# Patient Record
Sex: Male | Born: 1980
Health system: Southern US, Community
[De-identification: ages and names within clinical notes are randomized; demographics above are authoritative.]

## PROBLEM LIST (undated history)

## (undated) HISTORY — PX: TONSILLECTOMY: SUR1361

---

## 2004-04-21 ENCOUNTER — Ambulatory Visit: Payer: Self-pay | Admitting: Internal Medicine

## 2004-04-28 ENCOUNTER — Ambulatory Visit (HOSPITAL_COMMUNITY): Admission: RE | Admit: 2004-04-28 | Discharge: 2004-04-28 | Payer: Self-pay | Admitting: Internal Medicine

## 2004-05-12 ENCOUNTER — Ambulatory Visit (HOSPITAL_COMMUNITY): Admission: RE | Admit: 2004-05-12 | Discharge: 2004-05-12 | Payer: Self-pay | Admitting: Internal Medicine

## 2004-05-12 ENCOUNTER — Ambulatory Visit (HOSPITAL_COMMUNITY): Admission: RE | Admit: 2004-05-12 | Discharge: 2004-05-12 | Payer: Self-pay | Admitting: Family Medicine

## 2004-06-16 ENCOUNTER — Ambulatory Visit (HOSPITAL_COMMUNITY): Admission: RE | Admit: 2004-06-16 | Discharge: 2004-06-16 | Payer: Self-pay | Admitting: Family Medicine

## 2004-07-21 ENCOUNTER — Ambulatory Visit (HOSPITAL_COMMUNITY): Admission: RE | Admit: 2004-07-21 | Discharge: 2004-07-21 | Payer: Self-pay | Admitting: Family Medicine

## 2004-10-25 ENCOUNTER — Ambulatory Visit: Payer: Self-pay | Admitting: Orthopedic Surgery

## 2008-11-17 ENCOUNTER — Ambulatory Visit (HOSPITAL_COMMUNITY): Admission: RE | Admit: 2008-11-17 | Discharge: 2008-11-17 | Payer: Self-pay | Admitting: Family Medicine

## 2013-05-07 ENCOUNTER — Emergency Department (HOSPITAL_COMMUNITY): Payer: BC Managed Care – PPO

## 2013-05-07 ENCOUNTER — Encounter (HOSPITAL_COMMUNITY): Payer: Self-pay | Admitting: Emergency Medicine

## 2013-05-07 DIAGNOSIS — F17200 Nicotine dependence, unspecified, uncomplicated: Secondary | ICD-10-CM | POA: Insufficient documentation

## 2013-05-07 DIAGNOSIS — R071 Chest pain on breathing: Secondary | ICD-10-CM | POA: Insufficient documentation

## 2013-05-07 DIAGNOSIS — R1011 Right upper quadrant pain: Secondary | ICD-10-CM | POA: Insufficient documentation

## 2013-05-07 DIAGNOSIS — R6883 Chills (without fever): Secondary | ICD-10-CM | POA: Insufficient documentation

## 2013-05-07 NOTE — ED Notes (Signed)
Pt reports right side pain that started yesterday, now pain moving to right chest. Pt denies any injury to that side, pain worse when takes a deep breath.

## 2013-05-08 ENCOUNTER — Emergency Department (HOSPITAL_COMMUNITY)
Admission: EM | Admit: 2013-05-08 | Discharge: 2013-05-08 | Disposition: A | Discharge status: Home / Self Care | Payer: BC Managed Care – PPO | Attending: Emergency Medicine | Admitting: Emergency Medicine

## 2013-05-08 ENCOUNTER — Ambulatory Visit (HOSPITAL_COMMUNITY)
Admit: 2013-05-08 | Discharge: 2013-05-08 | Disposition: A | Discharge status: Home / Self Care | Payer: BC Managed Care – PPO | Source: Ambulatory Visit | Attending: Emergency Medicine | Admitting: Emergency Medicine

## 2013-05-08 DIAGNOSIS — R1010 Upper abdominal pain, unspecified: Secondary | ICD-10-CM

## 2013-05-08 DIAGNOSIS — K7689 Other specified diseases of liver: Secondary | ICD-10-CM | POA: Insufficient documentation

## 2013-05-08 DIAGNOSIS — R1011 Right upper quadrant pain: Secondary | ICD-10-CM | POA: Insufficient documentation

## 2013-05-08 DIAGNOSIS — R109 Unspecified abdominal pain: Secondary | ICD-10-CM | POA: Insufficient documentation

## 2013-05-08 LAB — COMPREHENSIVE METABOLIC PANEL
ALK PHOS: 74 U/L (ref 39–117)
ALT: 43 U/L (ref 0–53)
AST: 25 U/L (ref 0–37)
Albumin: 3.8 g/dL (ref 3.5–5.2)
BUN: 11 mg/dL (ref 6–23)
CO2: 24 meq/L (ref 19–32)
Calcium: 9.3 mg/dL (ref 8.4–10.5)
Chloride: 102 mEq/L (ref 96–112)
Creatinine, Ser: 0.66 mg/dL (ref 0.50–1.35)
GLUCOSE: 106 mg/dL — AB (ref 70–99)
POTASSIUM: 3.9 meq/L (ref 3.7–5.3)
SODIUM: 139 meq/L (ref 137–147)
Total Bilirubin: 0.3 mg/dL (ref 0.3–1.2)
Total Protein: 7.6 g/dL (ref 6.0–8.3)

## 2013-05-08 LAB — CBC WITH DIFFERENTIAL/PLATELET
Basophils Absolute: 0 10*3/uL (ref 0.0–0.1)
Basophils Relative: 0 % (ref 0–1)
EOS PCT: 1 % (ref 0–5)
Eosinophils Absolute: 0.1 10*3/uL (ref 0.0–0.7)
HCT: 44.3 % (ref 39.0–52.0)
Hemoglobin: 15.6 g/dL (ref 13.0–17.0)
Lymphocytes Relative: 31 % (ref 12–46)
Lymphs Abs: 4.2 10*3/uL — ABNORMAL HIGH (ref 0.7–4.0)
MCH: 30.6 pg (ref 26.0–34.0)
MCHC: 35.2 g/dL (ref 30.0–36.0)
MCV: 87 fL (ref 78.0–100.0)
MONOS PCT: 12 % (ref 3–12)
Monocytes Absolute: 1.6 10*3/uL — ABNORMAL HIGH (ref 0.1–1.0)
NEUTROS PCT: 56 % (ref 43–77)
Neutro Abs: 7.8 10*3/uL — ABNORMAL HIGH (ref 1.7–7.7)
PLATELETS: 191 10*3/uL (ref 150–400)
RBC: 5.09 MIL/uL (ref 4.22–5.81)
RDW: 13.3 % (ref 11.5–15.5)
WBC: 13.7 10*3/uL — AB (ref 4.0–10.5)

## 2013-05-08 LAB — LIPASE, BLOOD: Lipase: 26 U/L (ref 11–59)

## 2013-05-08 MED ORDER — OXYCODONE-ACETAMINOPHEN 5-325 MG PO TABS: 2.0000 | ORAL_TABLET | ORAL | Status: AC | PRN

## 2013-05-08 MED ORDER — IBUPROFEN 600 MG PO TABS: 600.0000 mg | ORAL_TABLET | Freq: Four times a day (QID) | ORAL | Status: AC | PRN

## 2013-05-08 NOTE — ED Provider Notes (Signed)
CSN: [id number]     Arrival date & time 05/07/13  2246 History   First MD Initiated Contact with Patient 05/08/13 0025     Chief Complaint  Patient presents with  . Chest Pain  . Pleurisy   (Consider location/radiation/quality/duration/timing/severity/associated sxs/prior Treatment) Patient is a 33 y.o. male presenting with abdominal pain. The history is provided by the patient.  Abdominal Pain Pain location:  LLQ and RUQ Pain quality: aching and sharp   Pain radiates to:  R flank and chest Pain severity:  Severe Onset quality:  Gradual Duration:  2 days Timing:  Constant Progression:  Worsening Chronicity:  New Context: not previous surgeries   Relieved by:  Nothing Worsened by:  Deep breathing and position changes Ineffective treatments:  None tried Associated symptoms: chills   Associated symptoms: no anorexia, no cough, no diarrhea, no dysuria, no fever, no nausea, no shortness of breath and no vomiting  Chest pain: right side chest pain.    Francesca JewettBrad L Letendre is a 33 y.o. male who presents to the ED with abdominal pain that started yesterday. The pain is in the RLQ and radiates to the RUQ and to the right flank area. He has had similar pain in the past that got worse with eating.   History reviewed. No pertinent past medical history. Past Surgical History  Procedure Laterality Date  . Tonsillectomy     No family history on file. History  Substance Use Topics  . Smoking status: Current Some Day Smoker    Types: Cigarettes  . Smokeless tobacco: Not on file  . Alcohol Use: Yes    Review of Systems  Constitutional: Positive for chills. Negative for fever.  HENT: Negative.   Eyes: Negative for visual disturbance.  Respiratory: Negative for cough, chest tightness and shortness of breath.   Cardiovascular: Negative for palpitations and leg swelling. Chest pain: right side chest pain.  Gastrointestinal: Positive for abdominal pain. Negative for nausea, vomiting, diarrhea  and anorexia.  Genitourinary: Negative for dysuria, urgency and frequency.  Musculoskeletal: Negative for myalgias. Back pain: right flank pain.  Skin: Negative for rash.  Neurological: Negative for dizziness and headaches.  Psychiatric/Behavioral: Negative for confusion. The patient is not nervous/anxious.     Allergies  Review of patient's allergies indicates no known allergies.  Home Medications   Current Outpatient Rx  Name  Route  Sig  Dispense  Refill  . cyclobenzaprine (FLEXERIL) 10 MG tablet   Oral   Take 10 mg by mouth at bedtime.         Marland Kitchen. HYDROcodone-acetaminophen (NORCO) 10-325 MG per tablet   Oral   Take 1 tablet by mouth every 4 (four) hours as needed.          BP 135/82  Pulse 96  Temp(Src) 98.2 F (36.8 C) (Oral)  Resp 18  Ht 6' (1.829 m)  Wt 300 lb (136.079 kg)  BMI 40.68 kg/m2  SpO2 98% Physical Exam  Nursing note and vitals reviewed. Constitutional: He is oriented to person, place, and time. He appears well-developed and well-nourished. No distress.  HENT:  Head: Normocephalic and atraumatic.  Eyes: Conjunctivae and EOM are normal.  Neck: Neck supple.  Cardiovascular: Normal rate and regular rhythm.   Pulmonary/Chest: Effort normal. He has no wheezes. He has no rales. He exhibits tenderness.    Abdominal: Soft. There is tenderness in the right upper quadrant and right lower quadrant. CVA tenderness: right flank pain.  Musculoskeletal: Normal range of motion.  Neurological: He is  alert and oriented to person, place, and time. No cranial nerve deficit.  Skin: Skin is warm and dry.  Psychiatric: He has a normal mood and affect. His behavior is normal.    ED Course  ProceduresLabs Review IDg Chest 2 View  05/07/2013   CLINICAL DATA:  Chest pain for 20 hr.  Pleurisy.  EXAM: CHEST  2 VIEW  COMPARISON:  None.  FINDINGS: Mild hyperinflation. Midline trachea. Mild cardiomegaly. Mediastinal contours otherwise within normal limits. No pleural effusion  or pneumothorax. Clear lungs.  IMPRESSION: Mild cardiomegaly and hyperinflation; no acute disease.   Electronically Signed   By: Jeronimo Greaves M.D.   On: 05/07/2013 23:37    EKG Interpretation    Date/Time:  Thursday May 07 2013 23:01:01 EST Ventricular Rate:  90 PR Interval:  176 QRS Duration: 100 QT Interval:  364 QTC Calculation: 445 R Axis:   -61 Text Interpretation:  Normal sinus rhythm Incomplete right bundle branch block Left anterior fascicular block Moderate voltage criteria for LVH, may be normal variant Nonspecific T wave abnormality Abnormal ECG No previous ECGs available Confirmed by CAMPOS  MD, KEVIN ([id number]) on 05/07/2013 11:11:48 PM            MDM  33 y.o. male with abdominal pain that radiates to the right flank area and to the right chest wall. Labs pending, care turned over to Dr. Patria Mane.     [place], [place] 05/08/13 [phone]

## 2013-05-08 NOTE — Discharge Instructions (Signed)
Abdominal Pain, Adult °Many things can cause abdominal pain. Usually, abdominal pain is not caused by a disease and will improve without treatment. It can often be observed and treated at home. Your health care provider will do a physical exam and possibly order blood tests and X-rays to help determine the seriousness of your pain. However, in many cases, more time must pass before a clear cause of the pain can be found. Before that point, your health care provider may not know if you need more testing or further treatment. °HOME CARE INSTRUCTIONS  °Monitor your abdominal pain for any changes. The following actions may help to alleviate any discomfort you are experiencing: °· Only take over-the-counter or prescription medicines as directed by your health care provider. °· Do not take laxatives unless directed to do so by your health care provider. °· Try a clear liquid diet (broth, tea, or water) as directed by your health care provider. Slowly move to a bland diet as tolerated. °SEEK MEDICAL CARE IF: °· You have unexplained abdominal pain. °· You have abdominal pain associated with nausea or diarrhea. °· You have pain when you urinate or have a bowel movement. °· You experience abdominal pain that wakes you in the night. °· You have abdominal pain that is worsened or improved by eating food. °· You have abdominal pain that is worsened with eating fatty foods. °SEEK IMMEDIATE MEDICAL CARE IF:  °· Your pain does not go away within 2 hours. °· You have a fever. °· You keep throwing up (vomiting). °· Your pain is felt only in portions of the abdomen, such as the right side or the left lower portion of the abdomen. °· You pass bloody or black tarry stools. °MAKE SURE YOU: °· Understand these instructions.   °· Will watch your condition.   °· Will get help right away if you are not doing well or get worse.   °Document Released: 01/10/2005 Document Revised: 01/21/2013 Document Reviewed: 12/10/2012 °[employer]® Patient  Information ©2014 [employer]. ° °

## 2013-05-08 NOTE — ED Provider Notes (Signed)
Medical screening examination/treatment/procedure(s) were conducted as a shared visit with non-physician practitioner(s) and myself.  I personally evaluated the patient during the encounter.  EKG Interpretation    Date/Time:  Thursday May 07 2013 23:01:01 EST Ventricular Rate:  90 PR Interval:  176 QRS Duration: 100 QT Interval:  364 QTC Calculation: 445 R Axis:   -61 Text Interpretation:  Normal sinus rhythm Incomplete right bundle branch block Left anterior fascicular block Moderate voltage criteria for LVH, may be normal variant Nonspecific T wave abnormality Abnormal ECG No previous ECGs available Confirmed by Mckayla Mulcahey  MD, Tajanae Guilbault ([id number]) on 05/07/2013 11:11:48 PM            PERC negative.  May represent right-sided pleurisy.  He does have some intermittent component that is associated with the right flank and right upper quadrant abdominal pain.  Questionable biliary colic.  Patient be set up for an ultrasound in the morning to better define the presence of gallstones.  Home with pain medicine and PCP followup.  Lyanne CoKevin M Amaya Blakeman, MD 05/08/13 [phone]

## 2013-05-13 MED FILL — Oxycodone w/ Acetaminophen Tab 5-325 MG: ORAL | Qty: 6 | Status: AC

## 2014-08-10 ENCOUNTER — Other Ambulatory Visit (HOSPITAL_COMMUNITY): Payer: Self-pay | Admitting: Physical Medicine and Rehabilitation

## 2014-08-10 DIAGNOSIS — M5415 Radiculopathy, thoracolumbar region: Secondary | ICD-10-CM

## 2014-08-10 DIAGNOSIS — M5413 Radiculopathy, cervicothoracic region: Secondary | ICD-10-CM

## 2014-08-18 ENCOUNTER — Ambulatory Visit (HOSPITAL_COMMUNITY): Payer: 59

## 2014-08-30 ENCOUNTER — Ambulatory Visit (HOSPITAL_COMMUNITY)
Admission: RE | Admit: 2014-08-30 | Discharge: 2014-08-30 | Disposition: A | Discharge status: Home / Self Care | Payer: 59 | Source: Ambulatory Visit | Attending: Physical Medicine and Rehabilitation | Admitting: Physical Medicine and Rehabilitation

## 2014-08-30 DIAGNOSIS — M5413 Radiculopathy, cervicothoracic region: Secondary | ICD-10-CM | POA: Insufficient documentation

## 2014-08-30 DIAGNOSIS — M5415 Radiculopathy, thoracolumbar region: Secondary | ICD-10-CM

## 2015-08-26 DIAGNOSIS — G894 Chronic pain syndrome: Secondary | ICD-10-CM | POA: Diagnosis not present

## 2015-08-26 DIAGNOSIS — Z1389 Encounter for screening for other disorder: Secondary | ICD-10-CM | POA: Diagnosis not present

## 2015-08-26 DIAGNOSIS — Z6841 Body Mass Index (BMI) 40.0 and over, adult: Secondary | ICD-10-CM | POA: Diagnosis not present

## 2015-11-25 DIAGNOSIS — Z6841 Body Mass Index (BMI) 40.0 and over, adult: Secondary | ICD-10-CM | POA: Diagnosis not present

## 2015-11-25 DIAGNOSIS — Z1389 Encounter for screening for other disorder: Secondary | ICD-10-CM | POA: Diagnosis not present

## 2015-11-25 DIAGNOSIS — G894 Chronic pain syndrome: Secondary | ICD-10-CM | POA: Diagnosis not present

## 2016-01-02 DIAGNOSIS — G894 Chronic pain syndrome: Secondary | ICD-10-CM | POA: Diagnosis not present

## 2016-01-02 DIAGNOSIS — Z1389 Encounter for screening for other disorder: Secondary | ICD-10-CM | POA: Diagnosis not present

## 2016-01-02 DIAGNOSIS — E669 Obesity, unspecified: Secondary | ICD-10-CM | POA: Diagnosis not present

## 2016-01-02 DIAGNOSIS — Z6841 Body Mass Index (BMI) 40.0 and over, adult: Secondary | ICD-10-CM | POA: Diagnosis not present

## 2016-04-04 DIAGNOSIS — M5417 Radiculopathy, lumbosacral region: Secondary | ICD-10-CM | POA: Diagnosis not present

## 2016-04-04 DIAGNOSIS — Z6841 Body Mass Index (BMI) 40.0 and over, adult: Secondary | ICD-10-CM | POA: Diagnosis not present

## 2016-04-04 DIAGNOSIS — Z1389 Encounter for screening for other disorder: Secondary | ICD-10-CM | POA: Diagnosis not present

## 2016-04-04 DIAGNOSIS — G894 Chronic pain syndrome: Secondary | ICD-10-CM | POA: Diagnosis not present

## 2016-04-04 DIAGNOSIS — M5136 Other intervertebral disc degeneration, lumbar region: Secondary | ICD-10-CM | POA: Diagnosis not present

## 2016-06-29 DIAGNOSIS — G894 Chronic pain syndrome: Secondary | ICD-10-CM | POA: Diagnosis not present

## 2016-06-29 DIAGNOSIS — Z6841 Body Mass Index (BMI) 40.0 and over, adult: Secondary | ICD-10-CM | POA: Diagnosis not present

## 2016-06-29 DIAGNOSIS — M47816 Spondylosis without myelopathy or radiculopathy, lumbar region: Secondary | ICD-10-CM | POA: Diagnosis not present

## 2016-06-29 DIAGNOSIS — Z1389 Encounter for screening for other disorder: Secondary | ICD-10-CM | POA: Diagnosis not present

## 2016-10-05 DIAGNOSIS — Z6841 Body Mass Index (BMI) 40.0 and over, adult: Secondary | ICD-10-CM | POA: Diagnosis not present

## 2016-10-05 DIAGNOSIS — Z1389 Encounter for screening for other disorder: Secondary | ICD-10-CM | POA: Diagnosis not present

## 2016-10-05 DIAGNOSIS — G894 Chronic pain syndrome: Secondary | ICD-10-CM | POA: Diagnosis not present

## 2016-10-05 DIAGNOSIS — E669 Obesity, unspecified: Secondary | ICD-10-CM | POA: Diagnosis not present

## 2017-01-01 DIAGNOSIS — Z6841 Body Mass Index (BMI) 40.0 and over, adult: Secondary | ICD-10-CM | POA: Diagnosis not present

## 2017-01-01 DIAGNOSIS — Z1389 Encounter for screening for other disorder: Secondary | ICD-10-CM | POA: Diagnosis not present

## 2017-01-01 DIAGNOSIS — E669 Obesity, unspecified: Secondary | ICD-10-CM | POA: Diagnosis not present

## 2017-01-01 DIAGNOSIS — G894 Chronic pain syndrome: Secondary | ICD-10-CM | POA: Diagnosis not present

## 2017-01-01 DIAGNOSIS — B353 Tinea pedis: Secondary | ICD-10-CM | POA: Diagnosis not present

## 2017-02-28 DIAGNOSIS — N201 Calculus of ureter: Secondary | ICD-10-CM | POA: Diagnosis not present

## 2017-02-28 DIAGNOSIS — E669 Obesity, unspecified: Secondary | ICD-10-CM | POA: Diagnosis not present

## 2017-02-28 DIAGNOSIS — N132 Hydronephrosis with renal and ureteral calculous obstruction: Secondary | ICD-10-CM | POA: Diagnosis not present

## 2017-02-28 DIAGNOSIS — R109 Unspecified abdominal pain: Secondary | ICD-10-CM | POA: Diagnosis not present

## 2017-03-05 IMAGING — MR MR LUMBAR SPINE W/O CM
4 of 5 series · 14 of 48 positions shown · non-contrast
Comparison: Lumbar MRI 07/21/2004.

+CLINICAL DATA:
34-year-old male with lumbar back pain radiating to the left lower
extremity. Symptoms x6 months with no known injury. Initial
encounter. Query left L5 and S1 radiculopathy.

EXAM:
MRI LUMBAR SPINE WITHOUT CONTRAST
TECHNIQUE: Multiplanar, multisequence MR imaging of the lumbar spine was
performed. No intravenous contrast was administered.

[Series 2: T2 · sagittal · 4.0mm · 0.89mm/px · 4 of 15 slices shown (1 of 2)]
[im 1/15]
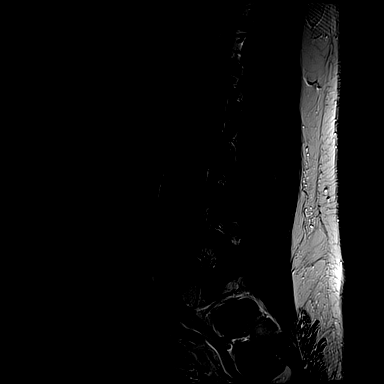
[im 5/15]
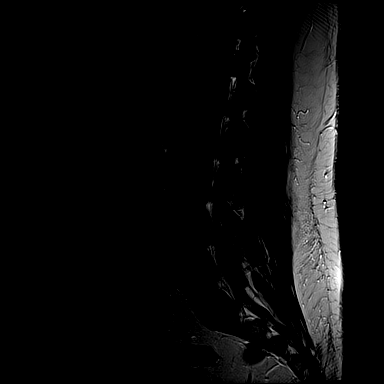
[im 10/15]
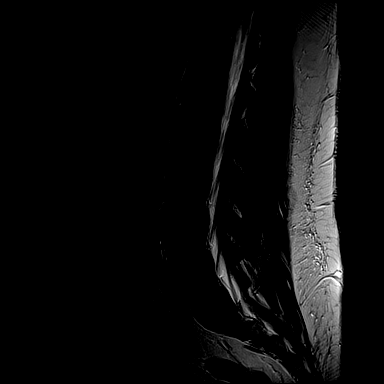
[im 15/15]
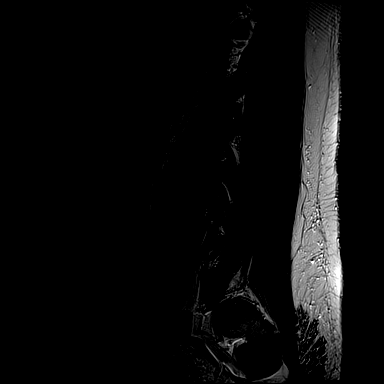

[Series 3: T1 · sagittal · 4.0mm · 0.44mm/px · 3 of 15 slices shown (1 of 2)]
[im 1/15]
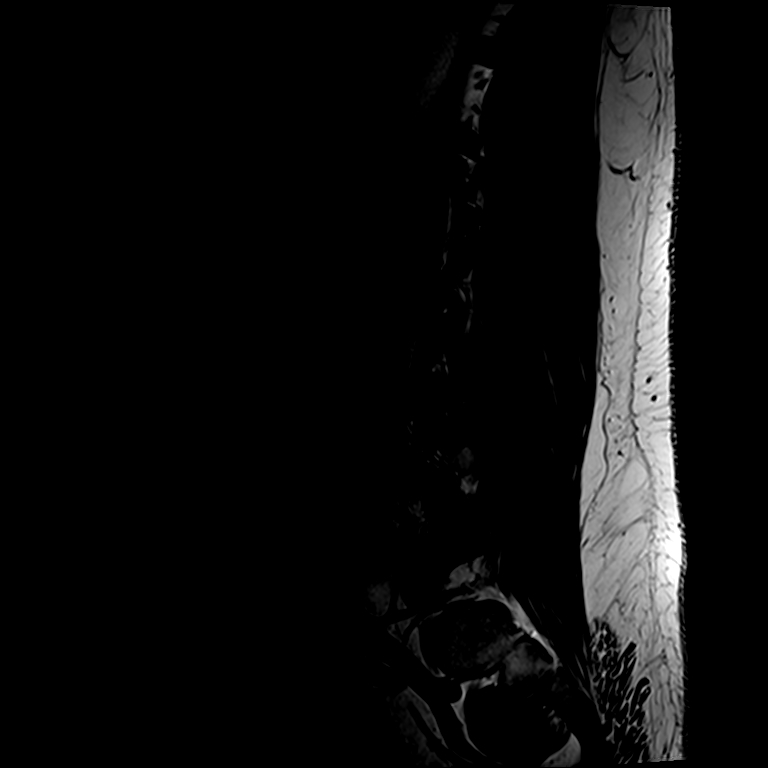
[im 8/15]
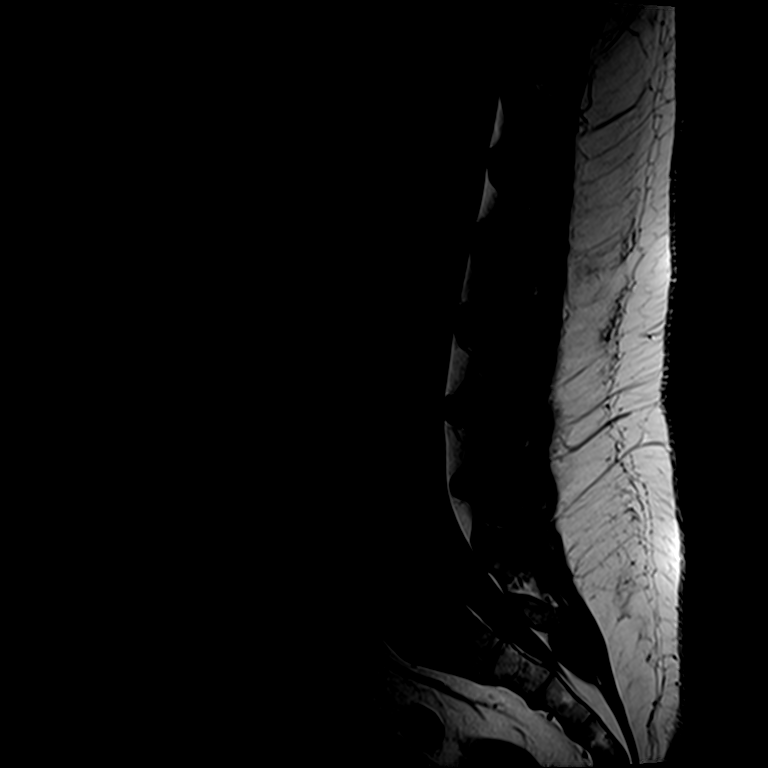
[im 15/15]
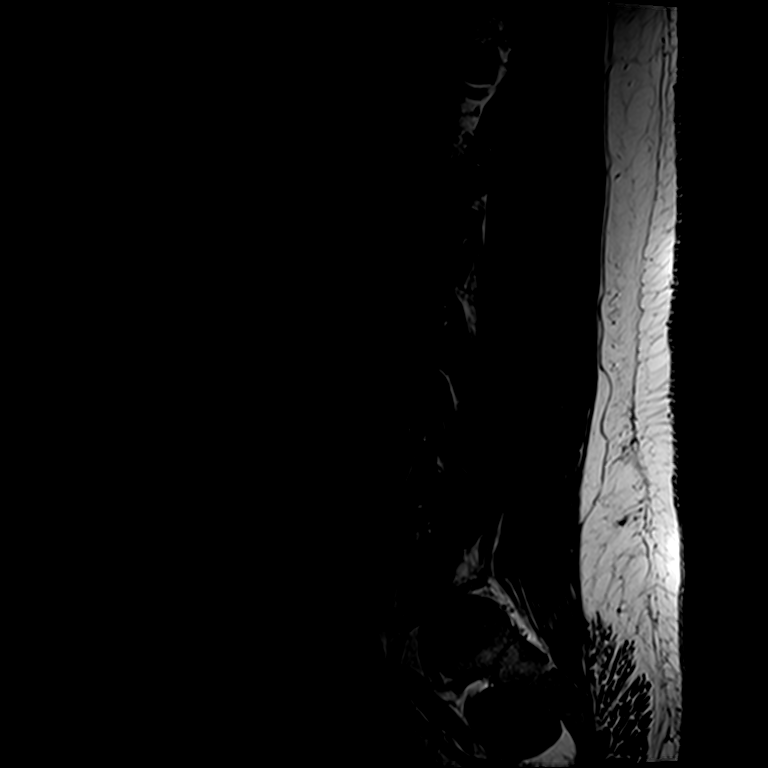

[Series 5: T2 · axial · 4.0mm · 0.32mm/px · z∈[-172,+73]mm · 4 of 57 slices shown (2 of 2)]
[im 4/57]
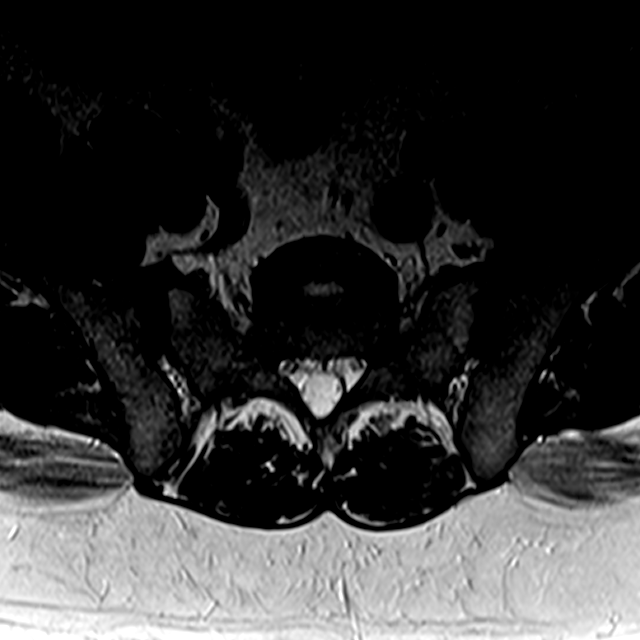
[im 8/57]
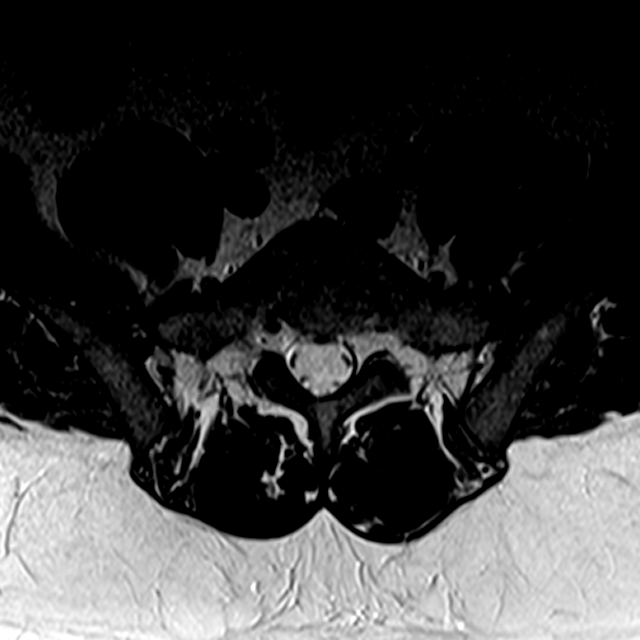
[im 29/57]
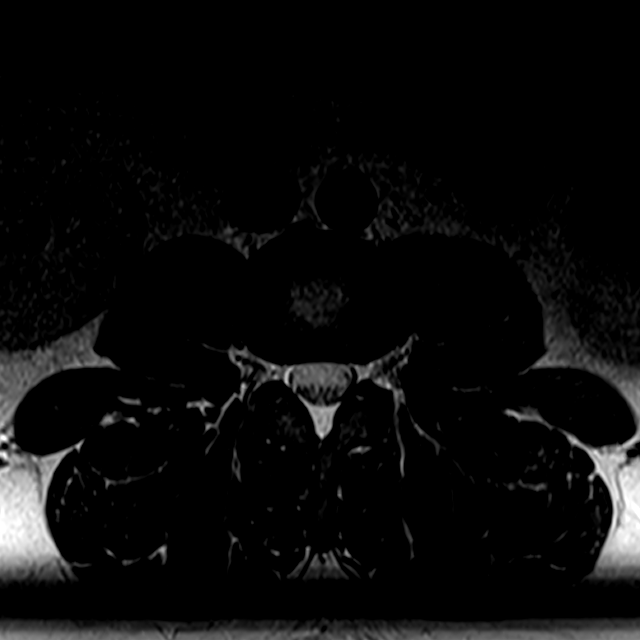
[im 50/57]
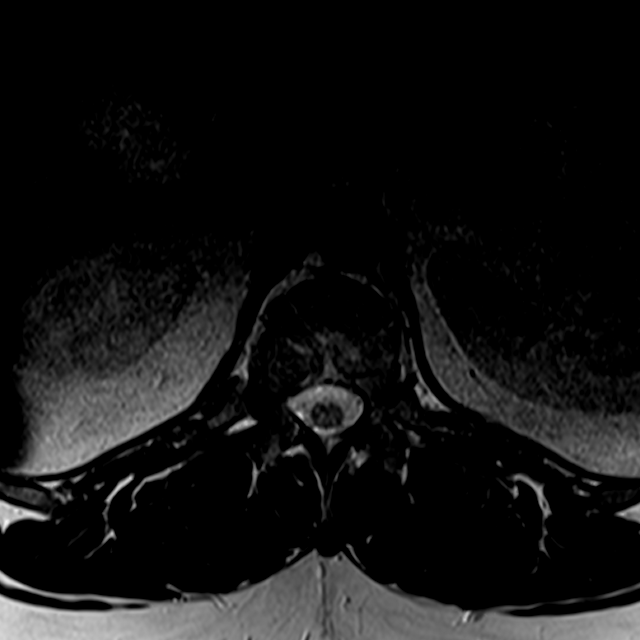

[Series 6: T1 · axial · 4.0mm · 0.32mm/px · z∈[-155,+73]mm · 3 of 57 slices shown (2 of 2)]
[im 8/57]
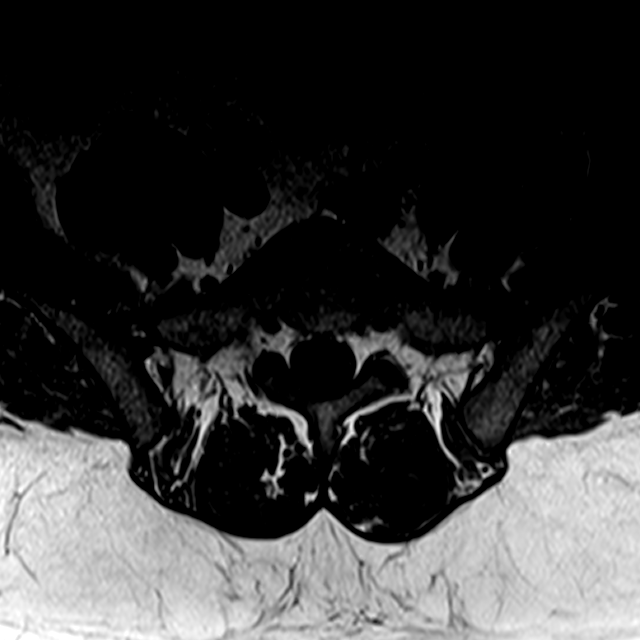
[im 29/57]
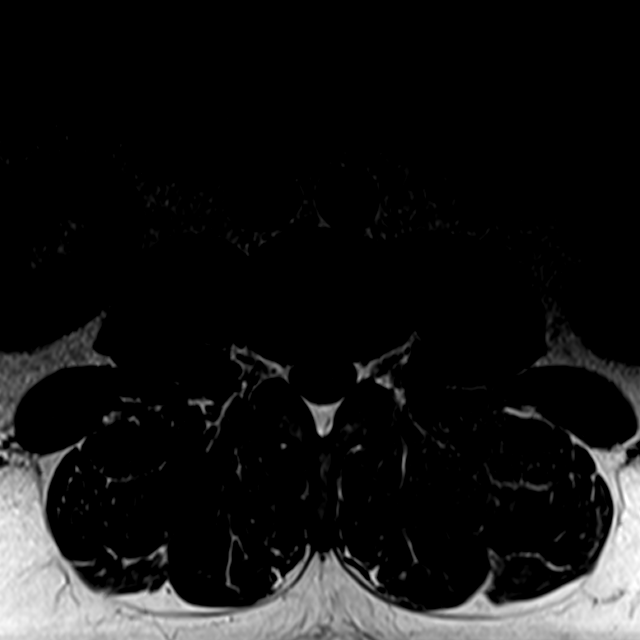
[im 50/57]
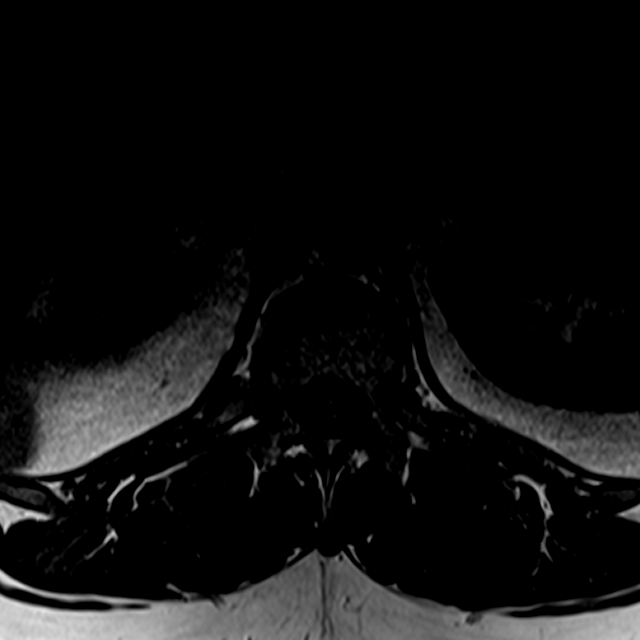

[14 of 48 positions shown; findings below may reference images not displayed]

FINDINGS: Same numbering system as in 1880 designating the lowest open disc
space as L5-S1. Stable and normal vertebral height and alignment. No
marrow edema or evidence of acute osseous abnormality.

Visualized lower thoracic spinal cord is normal with conus medularis
at L1-L2.

Visualized abdominal viscera and paraspinal soft tissues are within
normal limits.

T10-T11:  Negative.

T11-T12:  Negative.

T12-L1:  Negative except for mild facet hypertrophy.

L1-L2:  Negative except for mild facet hypertrophy.

L2-L3: Negative disc. Chronic mild to moderate facet hypertrophy.
Trace facet joint fluid has diminished. No stenosis.

L3-L4: Subtle left foraminal disc protrusion is new since 1880
(series 5, image 36). Minor disc bulging otherwise. Stable mild
facet hypertrophy. Mild to moderate left L3 foraminal stenosis.

L4-L5: Disc remains negative. Moderate facet hypertrophy greater on
the right is chronic and mildly progressed. Similar trace facet
joint fluid. No spinal or lateral recess stenosis. No foraminal
stenosis.

L5-S1: Transitional level. Vestigial disc space. There is a subtle
proximal left foraminal disc protrusion on series 5, image 51 in
proximity to the exiting left L5 nerve. The spinal canal or remains
widely patent. Negative facets.
IMPRESSION: 1. Small left disc herniation at L5-S1 affecting the proximal left
L5 neural foramen. Transitional anatomy re- identified at that
level. Correlation with radiographs is recommended prior to any
operative intervention.
2. Similar small left foraminal disc herniation at L3-L4 is also new
since 1880, with subsequent mild to moderate foraminal stenosis.
3. Chronic mild to moderate lumbar facet hypertrophy has not
significantly changed.

## 2017-04-03 DIAGNOSIS — Z1389 Encounter for screening for other disorder: Secondary | ICD-10-CM | POA: Diagnosis not present

## 2017-04-03 DIAGNOSIS — G894 Chronic pain syndrome: Secondary | ICD-10-CM | POA: Diagnosis not present

## 2017-04-03 DIAGNOSIS — Z0000 Encounter for general adult medical examination without abnormal findings: Secondary | ICD-10-CM | POA: Diagnosis not present

## 2017-04-03 DIAGNOSIS — Z6841 Body Mass Index (BMI) 40.0 and over, adult: Secondary | ICD-10-CM | POA: Diagnosis not present

## 2017-04-03 DIAGNOSIS — Z0001 Encounter for general adult medical examination with abnormal findings: Secondary | ICD-10-CM | POA: Diagnosis not present

## 2017-04-03 DIAGNOSIS — R7309 Other abnormal glucose: Secondary | ICD-10-CM | POA: Diagnosis not present

## 2017-06-12 DIAGNOSIS — M7989 Other specified soft tissue disorders: Secondary | ICD-10-CM | POA: Diagnosis not present

## 2017-06-12 DIAGNOSIS — F17200 Nicotine dependence, unspecified, uncomplicated: Secondary | ICD-10-CM | POA: Diagnosis not present

## 2017-06-12 DIAGNOSIS — L02612 Cutaneous abscess of left foot: Secondary | ICD-10-CM | POA: Diagnosis not present

## 2017-06-12 DIAGNOSIS — M79672 Pain in left foot: Secondary | ICD-10-CM | POA: Diagnosis not present

## 2017-06-14 DIAGNOSIS — L089 Local infection of the skin and subcutaneous tissue, unspecified: Secondary | ICD-10-CM | POA: Diagnosis not present

## 2017-06-14 DIAGNOSIS — Z6841 Body Mass Index (BMI) 40.0 and over, adult: Secondary | ICD-10-CM | POA: Diagnosis not present

## 2017-06-14 DIAGNOSIS — Z1389 Encounter for screening for other disorder: Secondary | ICD-10-CM | POA: Diagnosis not present

## 2017-06-21 ENCOUNTER — Encounter (INDEPENDENT_AMBULATORY_CARE_PROVIDER_SITE_OTHER): Payer: Self-pay | Admitting: Orthopedic Surgery

## 2017-06-21 ENCOUNTER — Ambulatory Visit (INDEPENDENT_AMBULATORY_CARE_PROVIDER_SITE_OTHER): Payer: BLUE CROSS/BLUE SHIELD | Admitting: Orthopedic Surgery

## 2017-06-21 DIAGNOSIS — M869 Osteomyelitis, unspecified: Secondary | ICD-10-CM | POA: Diagnosis not present

## 2017-06-21 DIAGNOSIS — M86272 Subacute osteomyelitis, left ankle and foot: Secondary | ICD-10-CM | POA: Diagnosis not present

## 2017-06-21 NOTE — Progress Notes (Signed)
   Office Visit Note   Patient: Francesca JewettBrad L Distler           Date of Birth: 1981/02/27           MRN: [id number] Visit Date: 06/21/2017              Requested by: Assunta FoundGolding, John, MD [address] [place], [place] [address] PCP: Assunta FoundGolding, John, MD  No chief complaint on file.     HPI: Patient is a 37 year old gentleman with a negative history of diabetes who has had an ulcer over the great toe IP joint.  2 weeks ago he had radiographs which were negative for osteomyelitis.  Wound cultures were positive for staph and patient was treated with doxycycline and Keflex.  He has completed his course of antibiotics.  Patient is a smoker.  Assessment & Plan: Visit Diagnoses:  1. Osteomyelitis of toe of left foot (HCC)     Plan: We will obtain an MRI scan of the left foot evaluate the extent of the bony infection he definitely has an infection of the IP joint.  This is open and draining and I do not think he needs any further antibiotics.  We will follow-up next week we will plan for surgical intervention once the MRI scan is obtained.  Follow-Up Instructions: Return in about 1 week (around 06/28/2017).   Ortho Exam  Patient is alert, oriented, no adenopathy, well-dressed, normal affect, normal respiratory effort. Examination patient has an antalgic gait he has a strong dorsalis pedis pulse is dorsiflexion of the ankle 20 degrees past neutral.  Examination is an ulcer over the IP joint which is approximately 2 cm in diameter.  The ulcer probes down to the IP joint with exposed bone.  There is no ascending cellulitis no purulent drainage no abscess.  There is no sausage digit swelling.  Imaging: No results found. No images are attached to the encounter.  Labs: No results found for: HGBA1C, ESRSEDRATE, CRP, LABURIC, REPTSTATUS, GRAMSTAIN, CULT, LABORGA  @LABSALLVALUES (HGBA1)@  There is no height or weight on file to calculate BMI.  Orders:  No orders of the defined types were placed in  this encounter.  No orders of the defined types were placed in this encounter.    Procedures: No procedures performed  Clinical Data: No additional findings.  ROS:  All other systems negative, except as noted in the HPI. Review of Systems  Objective: Vital Signs: There were no vitals taken for this visit.  Specialty Comments:  No specialty comments available.  PMFS History: Patient Active Problem List   Diagnosis Date Noted  . Osteomyelitis of toe of left foot (HCC) 06/21/2017   History reviewed. No pertinent past medical history.  History reviewed. No pertinent family history.  Past Surgical History:  Procedure Laterality Date  . TONSILLECTOMY     Social History   Occupational History  . Not on file  Tobacco Use  . Smoking status: Current Some Day Smoker    Types: Cigarettes  Substance and Sexual Activity  . Alcohol use: Yes  . Drug use: No  . Sexual activity: Not on file

## 2017-06-27 DIAGNOSIS — Z1389 Encounter for screening for other disorder: Secondary | ICD-10-CM | POA: Diagnosis not present

## 2017-06-27 DIAGNOSIS — Z6841 Body Mass Index (BMI) 40.0 and over, adult: Secondary | ICD-10-CM | POA: Diagnosis not present

## 2017-06-27 DIAGNOSIS — L02612 Cutaneous abscess of left foot: Secondary | ICD-10-CM | POA: Diagnosis not present

## 2017-06-27 DIAGNOSIS — G894 Chronic pain syndrome: Secondary | ICD-10-CM | POA: Diagnosis not present

## 2017-07-02 ENCOUNTER — Ambulatory Visit
Admission: RE | Admit: 2017-07-02 | Discharge: 2017-07-02 | Disposition: A | Discharge status: Home / Self Care | Payer: Self-pay | Source: Ambulatory Visit | Attending: Orthopedic Surgery | Admitting: Orthopedic Surgery

## 2017-07-02 DIAGNOSIS — M86272 Subacute osteomyelitis, left ankle and foot: Secondary | ICD-10-CM

## 2017-07-02 DIAGNOSIS — M19072 Primary osteoarthritis, left ankle and foot: Secondary | ICD-10-CM | POA: Diagnosis not present

## 2017-07-08 ENCOUNTER — Ambulatory Visit (INDEPENDENT_AMBULATORY_CARE_PROVIDER_SITE_OTHER): Payer: BLUE CROSS/BLUE SHIELD | Admitting: Orthopedic Surgery

## 2017-07-08 ENCOUNTER — Encounter (INDEPENDENT_AMBULATORY_CARE_PROVIDER_SITE_OTHER): Payer: Self-pay | Admitting: Orthopedic Surgery

## 2017-07-08 VITALS — Ht 72.0 in | Wt 300.0 lb

## 2017-07-08 DIAGNOSIS — M79671 Pain in right foot: Secondary | ICD-10-CM

## 2017-07-08 NOTE — Progress Notes (Signed)
   Office Visit Note   Patient: Daryl JewettBrad L [name]           Date of Birth: 05-19-1980           MRN: [id number] Visit Date: 07/08/2017              Requested by: Daryl FoundGolding, John, MD [address] [place], [place] [address] PCP: Daryl FoundGolding, John, MD  Chief Complaint  Patient presents with  . Left Foot - Follow-up    Left foot ulcer over the GT IP joint - s/p MRI      HPI: Patient is a 37 year old gentleman who presents in follow-up for a draining ulcer of the MTP joint of the left great toe.  Patient states that his toe is no longer draining no longer has redness or swelling states that he feels fine and is asymptomatic at this time he is status post an MRI scan.  Assessment & Plan: Visit Diagnoses:  1. Pain in right foot     Plan: We will draw a uric acid level today and would start medications if his uric acid is elevated.  Patient currently has no clinical or diagnostic findings of bony infection.  We will follow-up once the uric acid is obtained.  Follow-Up Instructions: Return if symptoms worsen or fail to improve.   Ortho Exam  Patient is alert, oriented, no adenopathy, well-dressed, normal affect, normal respiratory effort. Examination patient has a good dorsalis pedis and posterior tibial pulse.  There is no pain with range of motion of the great toe MTP joint.  There is no redness no cellulitis no swelling the open ulcers have completely healed.  Imaging: No results found. No images are attached to the encounter.  Labs: No results found for: HGBA1C, ESRSEDRATE, CRP, LABURIC, REPTSTATUS, GRAMSTAIN, CULT, LABORGA  @LABSALLVALUES (HGBA1)@  Body mass index is 40.69 kg/m.  Orders:  No orders of the defined types were placed in this encounter.  No orders of the defined types were placed in this encounter.    Procedures: No procedures performed  Clinical Data: No additional findings.  ROS:  All other systems negative, except as noted in the HPI. Review of  Systems  Objective: Vital Signs: Ht 6' (1.829 m)   Wt 300 lb (136.1 kg)   BMI 40.69 kg/m   Specialty Comments:  No specialty comments available.  PMFS History: Patient Active Problem List   Diagnosis Date Noted  . Osteomyelitis of toe of left foot (HCC) 06/21/2017   History reviewed. No pertinent past medical history.  History reviewed. No pertinent family history.  Past Surgical History:  Procedure Laterality Date  . TONSILLECTOMY     Social History   Occupational History  . Not on file  Tobacco Use  . Smoking status: Current Some Day Smoker    Types: Cigarettes  Substance and Sexual Activity  . Alcohol use: Yes  . Drug use: No  . Sexual activity: Not on file

## 2017-07-09 ENCOUNTER — Other Ambulatory Visit (INDEPENDENT_AMBULATORY_CARE_PROVIDER_SITE_OTHER): Payer: Self-pay

## 2017-07-09 LAB — URIC ACID: Uric Acid, Serum: 7.8 mg/dL (ref 4.0–8.0)

## 2017-07-09 MED ORDER — COLCHICINE 0.6 MG PO TABS: 0.6000 mg | ORAL_TABLET | Freq: Two times a day (BID) | ORAL | 1 refills | Status: AC

## 2017-07-09 MED ORDER — ALLOPURINOL 100 MG PO TABS: 100.0000 mg | ORAL_TABLET | Freq: Two times a day (BID) | ORAL | 3 refills | Status: AC

## 2017-07-09 NOTE — Progress Notes (Signed)
Called pt to advise of message below. rx faxed to pharm as advised by Dr. Lajoyce Cornersuda

## 2017-10-02 DIAGNOSIS — J3489 Other specified disorders of nose and nasal sinuses: Secondary | ICD-10-CM | POA: Diagnosis not present

## 2017-10-02 DIAGNOSIS — G894 Chronic pain syndrome: Secondary | ICD-10-CM | POA: Diagnosis not present

## 2017-10-02 DIAGNOSIS — Z6841 Body Mass Index (BMI) 40.0 and over, adult: Secondary | ICD-10-CM | POA: Diagnosis not present

## 2017-10-02 DIAGNOSIS — Z1389 Encounter for screening for other disorder: Secondary | ICD-10-CM | POA: Diagnosis not present

## 2017-10-02 DIAGNOSIS — W57XXXA Bitten or stung by nonvenomous insect and other nonvenomous arthropods, initial encounter: Secondary | ICD-10-CM | POA: Diagnosis not present

## 2017-11-23 DIAGNOSIS — L03115 Cellulitis of right lower limb: Secondary | ICD-10-CM | POA: Diagnosis not present

## 2017-11-23 DIAGNOSIS — Z6841 Body Mass Index (BMI) 40.0 and over, adult: Secondary | ICD-10-CM | POA: Diagnosis not present

## 2017-12-24 DIAGNOSIS — B352 Tinea manuum: Secondary | ICD-10-CM | POA: Diagnosis not present

## 2017-12-24 DIAGNOSIS — Z6841 Body Mass Index (BMI) 40.0 and over, adult: Secondary | ICD-10-CM | POA: Diagnosis not present

## 2017-12-24 DIAGNOSIS — B353 Tinea pedis: Secondary | ICD-10-CM | POA: Diagnosis not present

## 2017-12-31 DIAGNOSIS — Z6841 Body Mass Index (BMI) 40.0 and over, adult: Secondary | ICD-10-CM | POA: Diagnosis not present

## 2017-12-31 DIAGNOSIS — Z1389 Encounter for screening for other disorder: Secondary | ICD-10-CM | POA: Diagnosis not present

## 2017-12-31 DIAGNOSIS — G894 Chronic pain syndrome: Secondary | ICD-10-CM | POA: Diagnosis not present

## 2017-12-31 DIAGNOSIS — M1991 Primary osteoarthritis, unspecified site: Secondary | ICD-10-CM | POA: Diagnosis not present

## 2023-12-04 DIAGNOSIS — G4730 Sleep apnea, unspecified: Secondary | ICD-10-CM | POA: Diagnosis not present

## 2023-12-04 DIAGNOSIS — G894 Chronic pain syndrome: Secondary | ICD-10-CM | POA: Diagnosis not present

## 2024-01-01 DIAGNOSIS — Z833 Family history of diabetes mellitus: Secondary | ICD-10-CM | POA: Diagnosis not present

## 2024-01-01 DIAGNOSIS — M47817 Spondylosis without myelopathy or radiculopathy, lumbosacral region: Secondary | ICD-10-CM | POA: Diagnosis not present

## 2024-01-01 DIAGNOSIS — Z7689 Persons encountering health services in other specified circumstances: Secondary | ICD-10-CM | POA: Diagnosis not present

## 2024-01-01 DIAGNOSIS — Z6841 Body Mass Index (BMI) 40.0 and over, adult: Secondary | ICD-10-CM | POA: Diagnosis not present

## 2024-03-04 DIAGNOSIS — M25511 Pain in right shoulder: Secondary | ICD-10-CM | POA: Diagnosis not present

## 2024-03-04 DIAGNOSIS — M5416 Radiculopathy, lumbar region: Secondary | ICD-10-CM | POA: Diagnosis not present

## 2024-03-21 DIAGNOSIS — M5416 Radiculopathy, lumbar region: Secondary | ICD-10-CM | POA: Diagnosis not present

## 2024-03-25 DIAGNOSIS — Z833 Family history of diabetes mellitus: Secondary | ICD-10-CM | POA: Diagnosis not present

## 2024-04-01 DIAGNOSIS — Z7985 Long-term (current) use of injectable non-insulin antidiabetic drugs: Secondary | ICD-10-CM | POA: Diagnosis not present

## 2024-04-01 DIAGNOSIS — E1165 Type 2 diabetes mellitus with hyperglycemia: Secondary | ICD-10-CM | POA: Diagnosis not present

## 2024-04-01 DIAGNOSIS — Z0001 Encounter for general adult medical examination with abnormal findings: Secondary | ICD-10-CM | POA: Diagnosis not present

## 2024-04-01 DIAGNOSIS — Z6841 Body Mass Index (BMI) 40.0 and over, adult: Secondary | ICD-10-CM | POA: Diagnosis not present

## 2024-04-01 DIAGNOSIS — Z79899 Other long term (current) drug therapy: Secondary | ICD-10-CM | POA: Diagnosis not present

## 2024-04-01 DIAGNOSIS — Z7689 Persons encountering health services in other specified circumstances: Secondary | ICD-10-CM | POA: Diagnosis not present

## 2024-04-01 DIAGNOSIS — M47817 Spondylosis without myelopathy or radiculopathy, lumbosacral region: Secondary | ICD-10-CM | POA: Diagnosis not present

## 2024-04-01 DIAGNOSIS — E559 Vitamin D deficiency, unspecified: Secondary | ICD-10-CM | POA: Diagnosis not present
